# Patient Record
Sex: Female | Born: 1965 | Race: White | Hispanic: Yes | Marital: Married | Smoking: Never smoker
Health system: Southern US, Community
[De-identification: ages and names within clinical notes are randomized; demographics above are authoritative.]

## PROBLEM LIST (undated history)

## (undated) DIAGNOSIS — G43909 Migraine, unspecified, not intractable, without status migrainosus: Secondary | ICD-10-CM

## (undated) DIAGNOSIS — R42 Dizziness and giddiness: Secondary | ICD-10-CM

## (undated) HISTORY — DX: Migraine, unspecified, not intractable, without status migrainosus: G43.909

## (undated) HISTORY — PX: BREAST ENHANCEMENT SURGERY: SHX7

## (undated) HISTORY — DX: Dizziness and giddiness: R42

---

## 1995-12-13 HISTORY — PX: ABDOMINAL HYSTERECTOMY: SHX81

## 2008-10-05 IMAGING — US US-BREAST([ID])
1 series · 14 of 19 positions shown · non-contrast
Comparison: NONE

CLINICAL DATA: Left breast discharge. 

RIGHT BREAST ULTRASOUND

[Series 1: us breast · 0.05mm/px · 14 of 19 slices shown]
[im 1/19]
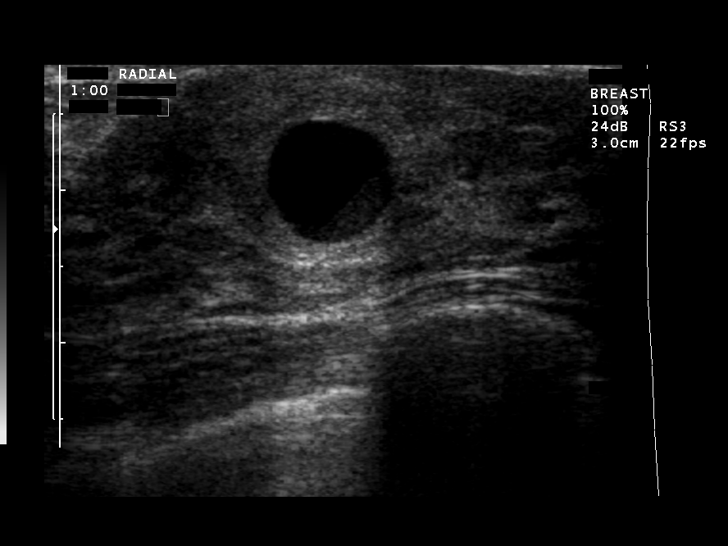
[im 3/19]
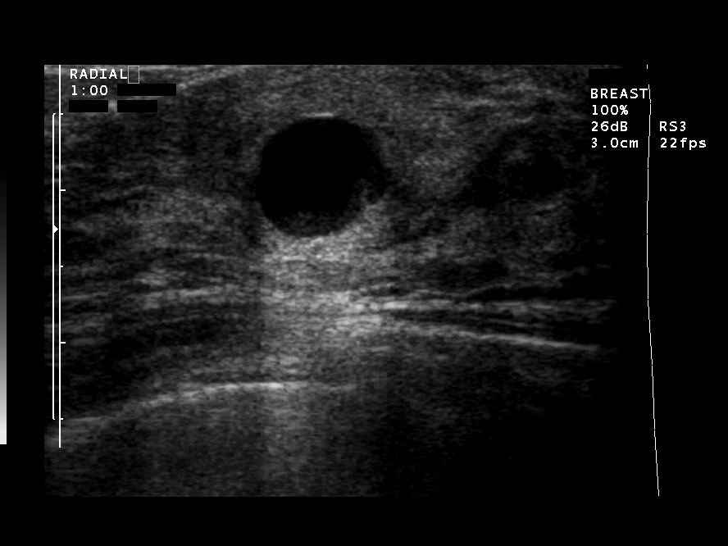
[im 4/19]
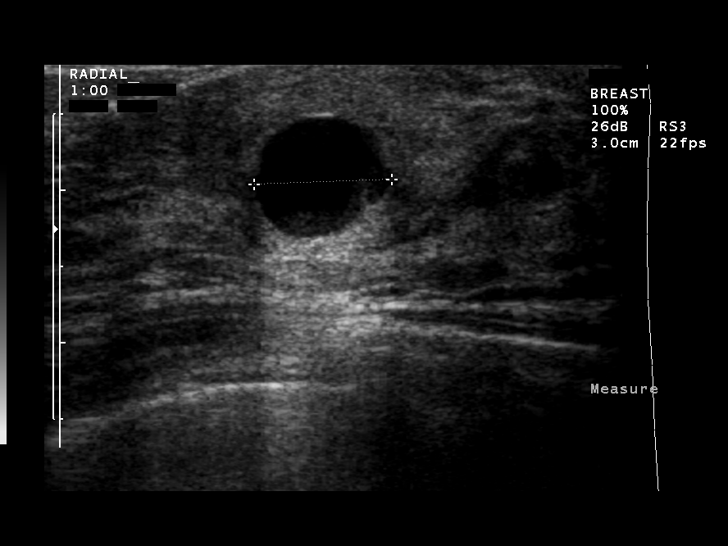
[im 5/19]
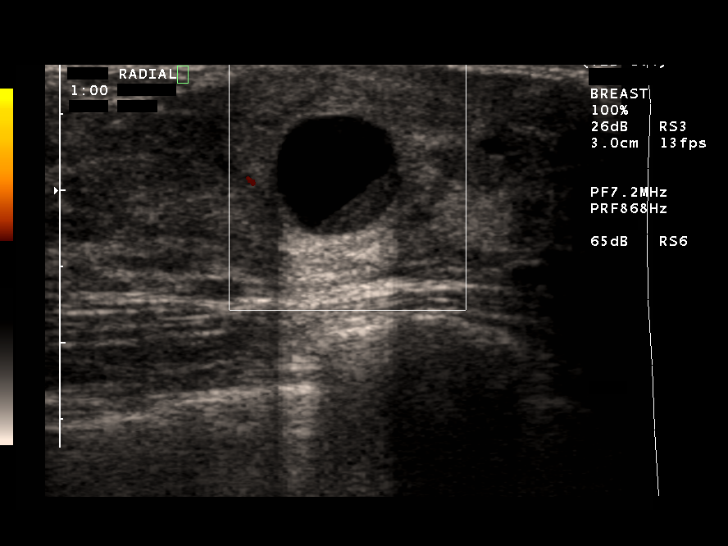
[im 7/19]
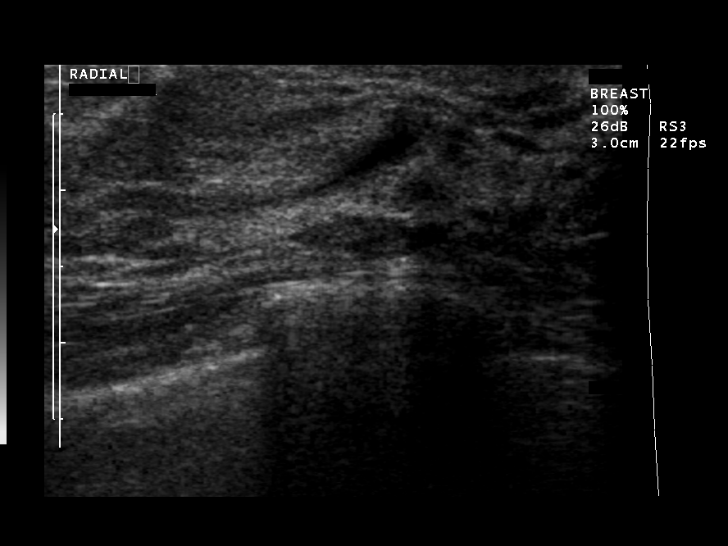
[im 8/19]
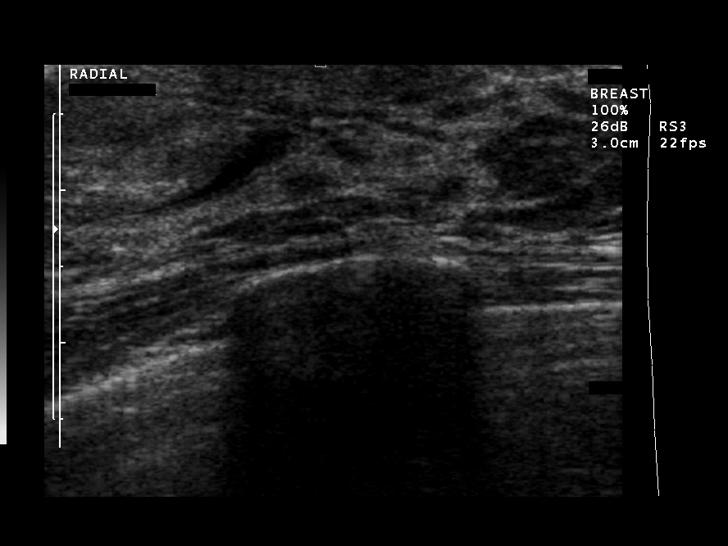
[im 9/19]
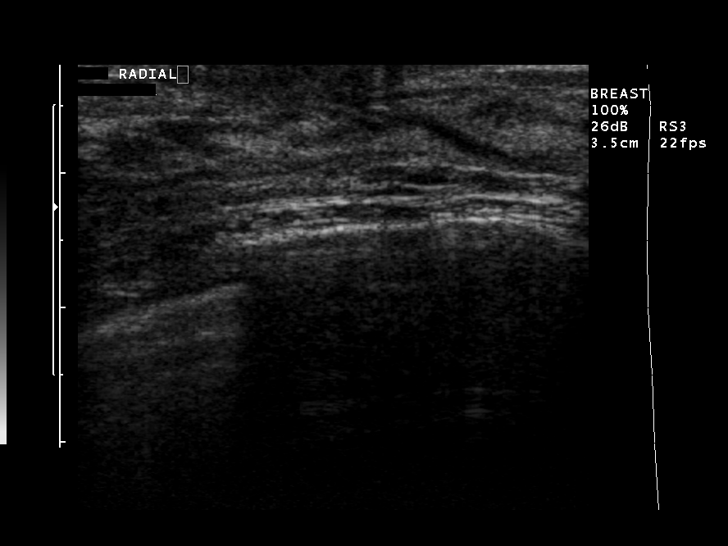
[im 11/19]
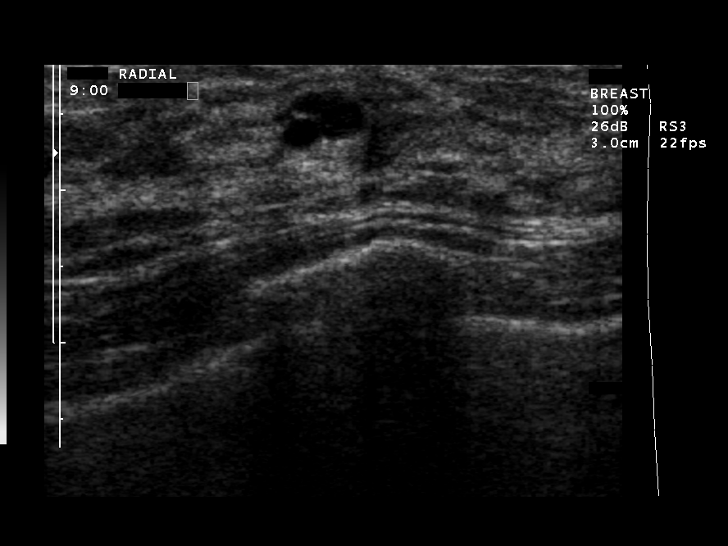
[im 12/19]
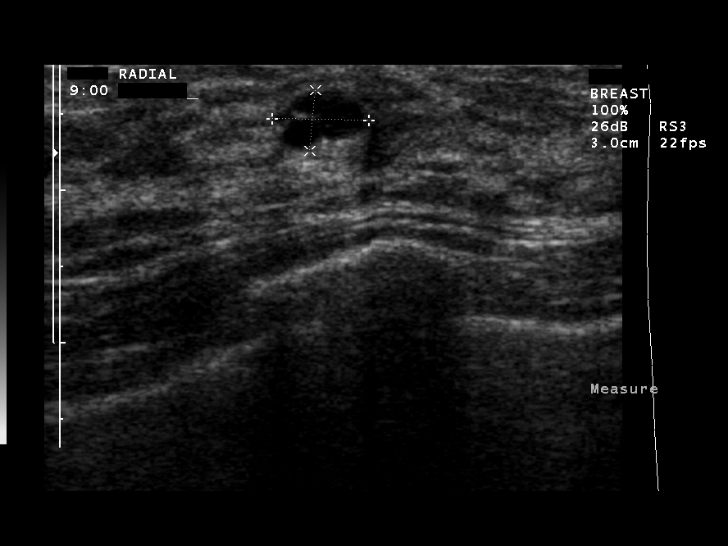
[im 13/19]
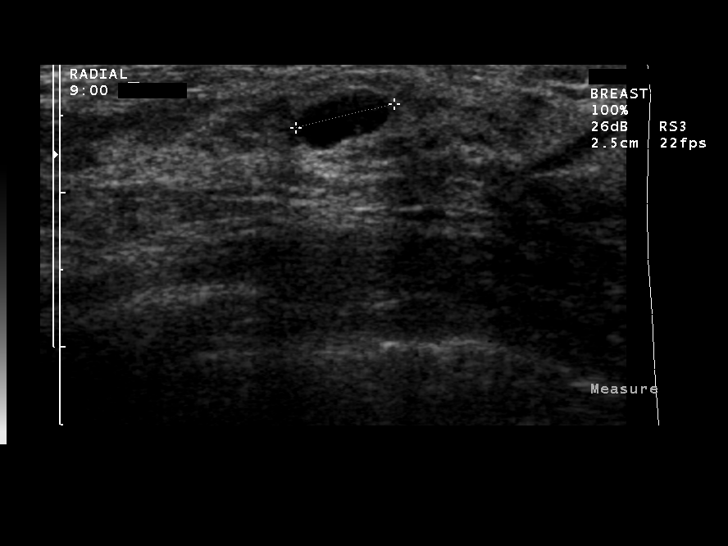
[im 15/19]
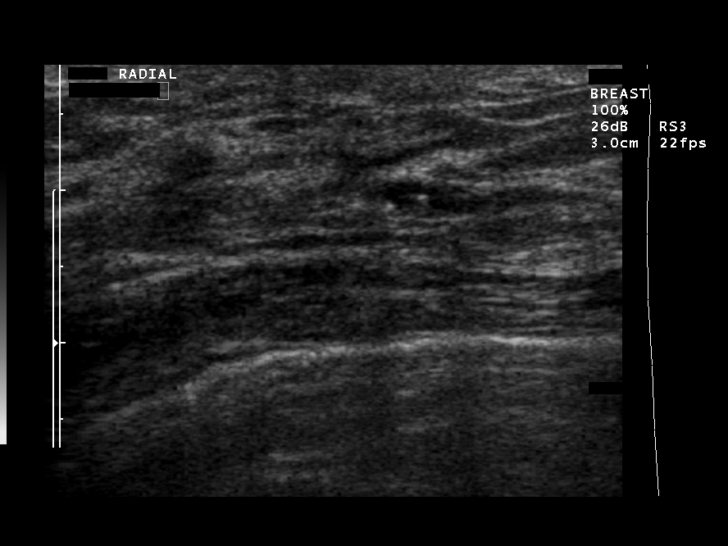
[im 16/19]
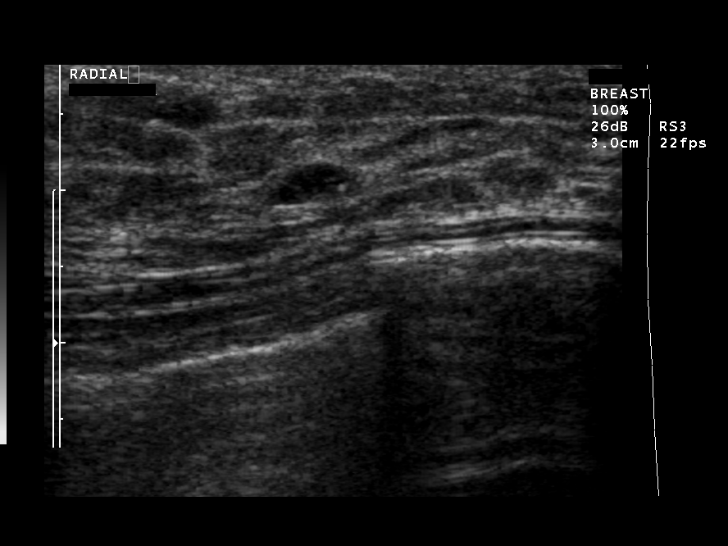
[im 17/19]
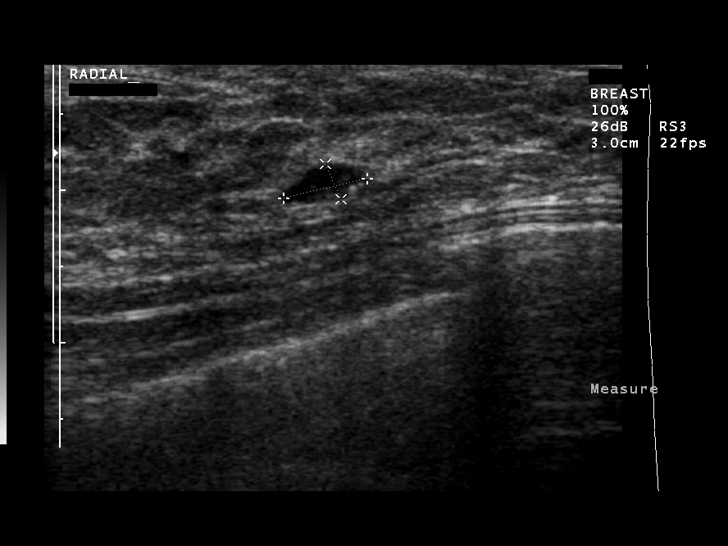
[im 19/19]
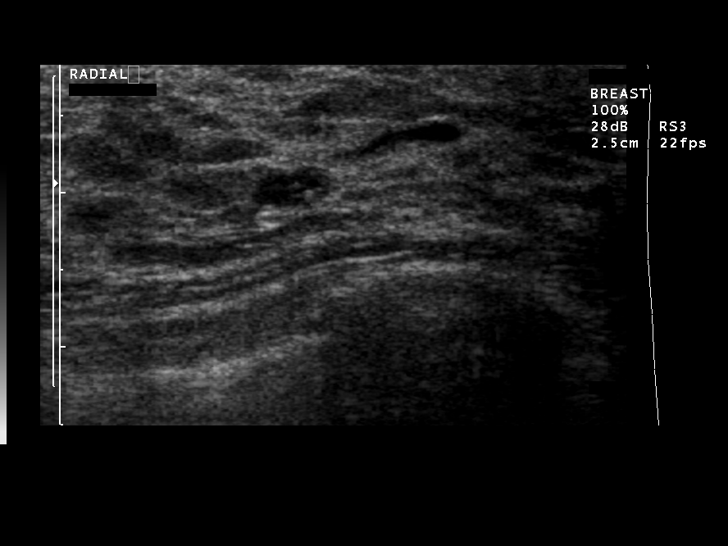

[14 of 19 positions shown; findings below may reference images not displayed]

FINDINGS: There is a cyst in the 1 o???clock position corresponding 
to the palpable abnormality noted on the mammogram. This measures 
approximately 8.5 x 8.0 x 9.0 mm. There is minimal debris within 
the cyst.
IMPRESSION: Cyst in the left breast corresponding to the 
mammographic abnormality. No mammographic findings suspicious for 
malignancy.
FINDINGS: Ultrasound of the right breast demonstrates minimal 
subcentimeter cystic change in the 9 o???clock position. No findings 
suspicious for malignancy in the right breast demonstrated by 
ultrasound. Right breast mammogram is unremarkable.
IMPRESSION: No mammographic or ultrasonographic findings 
suspicious for malignancy. BI-RADS 2- Benign Jumper, Nya Date:  01/29/2008 DAS  [REDACTED]

## 2019-07-24 ENCOUNTER — Encounter: Payer: Self-pay | Admitting: *Deleted

## 2019-07-29 ENCOUNTER — Other Ambulatory Visit: Payer: Self-pay

## 2019-07-29 ENCOUNTER — Ambulatory Visit: Payer: 59 | Admitting: Diagnostic Neuroimaging

## 2019-07-29 ENCOUNTER — Encounter: Payer: Self-pay | Admitting: Diagnostic Neuroimaging

## 2019-07-29 ENCOUNTER — Telehealth: Payer: Self-pay | Admitting: Diagnostic Neuroimaging

## 2019-07-29 VITALS — BP 120/79 | HR 69 | Temp 97.7°F | Ht 63.0 in | Wt 111.2 lb

## 2019-07-29 DIAGNOSIS — G43009 Migraine without aura, not intractable, without status migrainosus: Secondary | ICD-10-CM

## 2019-07-29 MED ORDER — AMITRIPTYLINE HCL 25 MG PO TABS: 25.0000 mg | ORAL_TABLET | Freq: Every day | ORAL | 3 refills | Status: DC

## 2019-07-29 MED ORDER — RIZATRIPTAN BENZOATE 10 MG PO TBDP: 10.0000 mg | ORAL_TABLET | ORAL | 11 refills | Status: AC | PRN

## 2019-07-29 NOTE — Patient Instructions (Signed)
MIGRAINE WITHOUT AURA - check MRI brain / MRA head - MIGRAINE PREVENTION --> start amitriptyline 25mg  at bedtime - MIGRAINE RESCUE --> rizatriptan 10mg  as needed for breakthrough headache; may repeat x 1 after 2 hours; max 2 tabs per day or 8 per month

## 2019-07-29 NOTE — Telephone Encounter (Signed)
[employer] order sent to GI. They will obtain the auth and reach out to the patient to schedule.  
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
## 2019-08-28 NOTE — Telephone Encounter (Signed)
Christella Scheuermann has the wrong DOB for the patient they have [birth year]. I spoke to the patient and informed her that she will have to give [employer] a call to update her DOB because that is holding up the process to get the the MRI's approved. Patient stated she was going to get it updated.

## 2019-08-29 ENCOUNTER — Other Ambulatory Visit: Payer: 59

## 2019-09-16 NOTE — Telephone Encounter (Signed)
Jasmin with [facility] informed me that the MRI Brain was approved but the MRA Head was canceled.. I called [employer] and they informed me the reason they canceled the MRA is because her last name is not the same as what we have and the dob is not the same as what we have. Since the patient is scheduled for the MRI Brain and for some reason that got approved for them to proceed on having the MRI Brain.

## 2019-09-18 ENCOUNTER — Ambulatory Visit
Admission: RE | Admit: 2019-09-18 | Discharge: 2019-09-18 | Disposition: A | Discharge status: Home / Self Care | Payer: 59 | Source: Ambulatory Visit | Attending: Diagnostic Neuroimaging | Admitting: Diagnostic Neuroimaging

## 2019-09-18 ENCOUNTER — Other Ambulatory Visit: Payer: 59

## 2019-09-18 ENCOUNTER — Other Ambulatory Visit: Payer: Self-pay

## 2019-09-18 DIAGNOSIS — G43009 Migraine without aura, not intractable, without status migrainosus: Secondary | ICD-10-CM

## 2019-09-18 MED ORDER — GADOBENATE DIMEGLUMINE 529 MG/ML IV SOLN
10.0000 mL | Freq: Once | INTRAVENOUS | Status: AC | PRN
  Administered 2019-09-18: 10 mL via INTRAVENOUS

## 2019-09-24 ENCOUNTER — Telehealth: Payer: Self-pay | Admitting: *Deleted

## 2019-09-24 NOTE — Telephone Encounter (Signed)
Called [facility], spoke with Terrial Rhodes and requested he call patient with MRI brain results of unremarkable scan with no acute abnormalities or abnormal lesions. He LVM advising patient of results and advised she call our office with any questions.

## 2019-10-30 ENCOUNTER — Encounter: Payer: Self-pay | Admitting: Family Medicine

## 2019-10-30 ENCOUNTER — Ambulatory Visit: Payer: 59 | Admitting: Family Medicine
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
## 2019-10-31 ENCOUNTER — Ambulatory Visit: Payer: 59 | Admitting: Family Medicine

## 2019-12-11 ENCOUNTER — Ambulatory Visit: Payer: 59 | Admitting: Family Medicine

## 2020-02-06 ENCOUNTER — Encounter: Payer: Self-pay | Admitting: Family Medicine

## 2020-02-06 ENCOUNTER — Telehealth: Payer: Self-pay | Admitting: Family Medicine

## 2020-02-06 ENCOUNTER — Other Ambulatory Visit: Payer: Self-pay

## 2020-02-06 ENCOUNTER — Ambulatory Visit (INDEPENDENT_AMBULATORY_CARE_PROVIDER_SITE_OTHER): Payer: 59 | Admitting: Family Medicine

## 2020-02-06 VITALS — BP 121/79 | HR 67 | Temp 97.6°F | Ht 61.0 in | Wt 116.0 lb

## 2020-02-06 DIAGNOSIS — Z87820 Personal history of traumatic brain injury: Secondary | ICD-10-CM | POA: Diagnosis not present

## 2020-02-06 DIAGNOSIS — R519 Headache, unspecified: Secondary | ICD-10-CM

## 2020-02-06 DIAGNOSIS — Z8249 Family history of ischemic heart disease and other diseases of the circulatory system: Secondary | ICD-10-CM

## 2020-02-06 DIAGNOSIS — G43009 Migraine without aura, not intractable, without status migrainosus: Secondary | ICD-10-CM | POA: Diagnosis not present

## 2020-02-06 MED ORDER — AJOVY 225 MG/1.5ML ~~LOC~~ SOAJ: 225.0000 mg | SUBCUTANEOUS | 11 refills | Status: AC

## 2020-02-06 NOTE — Patient Instructions (Signed)
We will try Ajovy monthly  Continue rizatriptan for abortive therapy  We will try to get MRA covered   Follow up in 3 months    Migraine Headache A migraine headache is a very strong throbbing pain on one side or both sides of your head. This type of headache can also cause other symptoms. It can last from 4 hours to 3 days. Talk with your doctor about what things may bring on (trigger) this condition. What are the causes? The exact cause of this condition is not known. This condition may be triggered or caused by:  Drinking alcohol.  Smoking.  Taking medicines, such as: ? Medicine used to treat chest pain (nitroglycerin). ? Birth control pills. ? Estrogen. ? Some blood pressure medicines.  Eating or drinking certain products.  Doing physical activity. Other things that may trigger a migraine headache include:  Having a menstrual period.  Pregnancy.  Hunger.  Stress.  Not getting enough sleep or getting too much sleep.  Weather changes.  Tiredness (fatigue). What increases the risk?  Being 85-28 years old.  Being female.  Having a family history of migraine headaches.  Being Caucasian.  Having depression or anxiety.  Being very overweight. What are the signs or symptoms?  A throbbing pain. This pain may: ? Happen in any area of the head, such as on one side or both sides. ? Make it hard to do daily activities. ? Get worse with physical activity. ? Get worse around bright lights or loud noises.  Other symptoms may include: ? Feeling sick to your stomach (nauseous). ? Vomiting. ? Dizziness. ? Being sensitive to bright lights, loud noises, or smells.  Before you get a migraine headache, you may get warning signs (an aura). An aura may include: ? Seeing flashing lights or having blind spots. ? Seeing bright spots, halos, or zigzag lines. ? Having tunnel vision or blurred vision. ? Having numbness or a tingling feeling. ? Having trouble  talking. ? Having weak muscles.  Some people have symptoms after a migraine headache (postdromal phase), such as: ? Tiredness. ? Trouble thinking (concentrating). How is this treated?  Taking medicines that: ? Relieve pain. ? Relieve the feeling of being sick to your stomach. ? Prevent migraine headaches.  Treatment may also include: ? Having acupuncture. ? Avoiding foods that bring on migraine headaches. ? Learning ways to control your body functions (biofeedback). ? Therapy to help you know and deal with negative thoughts (cognitive behavioral therapy). Follow these instructions at home: Medicines  Take over-the-counter and prescription medicines only as told by your doctor.  Ask your doctor if the medicine prescribed to you: ? Requires you to avoid driving or using heavy machinery. ? Can cause trouble pooping (constipation). You may need to take these steps to prevent or treat trouble pooping:  Drink enough fluid to keep your pee (urine) pale yellow.  Take over-the-counter or prescription medicines.  Eat foods that are high in fiber. These include beans, whole grains, and fresh fruits and vegetables.  Limit foods that are high in fat and sugar. These include fried or sweet foods. Lifestyle  Do not drink alcohol.  Do not use any products that contain nicotine or tobacco, such as cigarettes, e-cigarettes, and chewing tobacco. If you need help quitting, ask your doctor.  Get at least 8 hours of sleep every night.  Limit and deal with stress. General instructions      Keep a journal to find out what may bring on your  migraine headaches. For example, write down: ? What you eat and drink. ? How much sleep you get. ? Any change in what you eat or drink. ? Any change in your medicines.  If you have a migraine headache: ? Avoid things that make your symptoms worse, such as bright lights. ? It may help to lie down in a dark, quiet room. ? Do not drive or use heavy  machinery. ? Ask your doctor what activities are safe for you.  Keep all follow-up visits as told by your doctor. This is important. Contact a doctor if:  You get a migraine headache that is different or worse than others you have had.  You have more than 15 headache days in one month. Get help right away if:  Your migraine headache gets very bad.  Your migraine headache lasts longer than 72 hours.  You have a fever.  You have a stiff neck.  You have trouble seeing.  Your muscles feel weak or like you cannot control them.  You start to lose your balance a lot.  You start to have trouble walking.  You pass out (faint).  You have a seizure. Summary  A migraine headache is a very strong throbbing pain on one side or both sides of your head. These headaches can also cause other symptoms.  This condition may be treated with medicines and changes to your lifestyle.  Keep a journal to find out what may bring on your migraine headaches.  Contact a doctor if you get a migraine headache that is different or worse than others you have had.  Contact your doctor if you have more than 15 headache days in a month. This information is not intended to replace advice given to you by your health care provider. Make sure you discuss any questions you have with your health care provider. Document Revised: 03/22/2019 Document Reviewed: 01/10/2019 Elsevier Patient Education  [place].

## 2020-02-06 NOTE — Telephone Encounter (Signed)
Can you help me with getting MRA approved for migraines? Is this an option based on notes and history? TY!
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
## 2020-02-07 NOTE — Progress Notes (Signed)
I reviewed note and agree with plan.   Suanne Marker, MD 02/07/2020, 10:55 AM Certified in Neurology, Neurophysiology and Neuroimaging  [facility] [address], [address] [place], [place] [address] [phone]

## 2020-02-13 NOTE — Telephone Encounter (Signed)
[employer] pending faxed notes  

## 2020-02-17 NOTE — Telephone Encounter (Signed)
I did check the status it is in MD review they did receive my fax.

## 2020-02-18 NOTE — Telephone Encounter (Signed)
[employer] did not approve the MRA Head. There is an option to do a peer to peer the phone number is [phone] option 4. The case number is [id number]. It does need to be called to be scheduled. Under her insurance her name is Desiree Robinson and the case is under Dr. Marjory Lies name.

## 2020-02-19 NOTE — Telephone Encounter (Signed)
The reason for the denial   "Sudden onset of headache, we cannot approve this request. Your records show that you have a headache. The reason this request cannot be approved is because 1. Imaging may be supported for a sudden onset of headaches when one of the following applies to you. Your headache is the worst,most severe headache you have ever had. Your headache is one that feels like a clap of thunder in your head. Your headache is on one side of your head and your doctor suspects it is being caused by a tear in the wall of one of the blood vessels in your neck that supplies blood to your brain. Your headaches started when you were over 48 and there is concern for giant cell arteritis. This is a disease of the blood vessels. It can cause swelling and thickening of an artery. Your doctor is concerned that you have an acute blood clot in the channels that drain blood from your brain. Your have increased pressure in or around your brain that is causing swelling in the part of the optic nerve inside the eye."

## 2020-02-19 NOTE — Telephone Encounter (Signed)
Please let patient know that, unfortunately, insurance will not cover MRA. Although she does have a family history, insurance requires that she meet other qualifications including having a thunderclap headache, severe/worse headache ever, a suspicion for tear in vessels of neck or for a vascular condition called Giant Cell Arteritis. I am unable to support claims for any of these concerns. She may consider paying cash for test if she feels it is needed. If she has any new or worsening symptoms we can see her back.

## 2020-02-19 NOTE — Telephone Encounter (Signed)
I called pt and relayed the information from AL/NP and she verbalized understanding. She will call her insurance about this as well.

## 2021-12-22 ENCOUNTER — Ambulatory Visit: Payer: Self-pay | Admitting: Internal Medicine

## 2022-02-01 ENCOUNTER — Other Ambulatory Visit: Payer: Self-pay | Admitting: Nurse Practitioner

## 2022-02-01 ENCOUNTER — Ambulatory Visit
Admission: RE | Admit: 2022-02-01 | Discharge: 2022-02-01 | Disposition: A | Discharge status: Home / Self Care | Payer: 59 | Source: Ambulatory Visit | Attending: Nurse Practitioner | Admitting: Nurse Practitioner

## 2022-02-01 DIAGNOSIS — R52 Pain, unspecified: Secondary | ICD-10-CM

## 2022-10-13 IMAGING — CR DG THORACOLUMBAR SPINE 2V
2 series · 2 of 2 positions shown · non-contrast
Comparison: No recent prior.

CLINICAL DATA: History of pain.  No known injury.

EXAM:
THORACOLUMBAR SPINE 1V

[w thoraco-lumbar junction ap (1 of 2)]
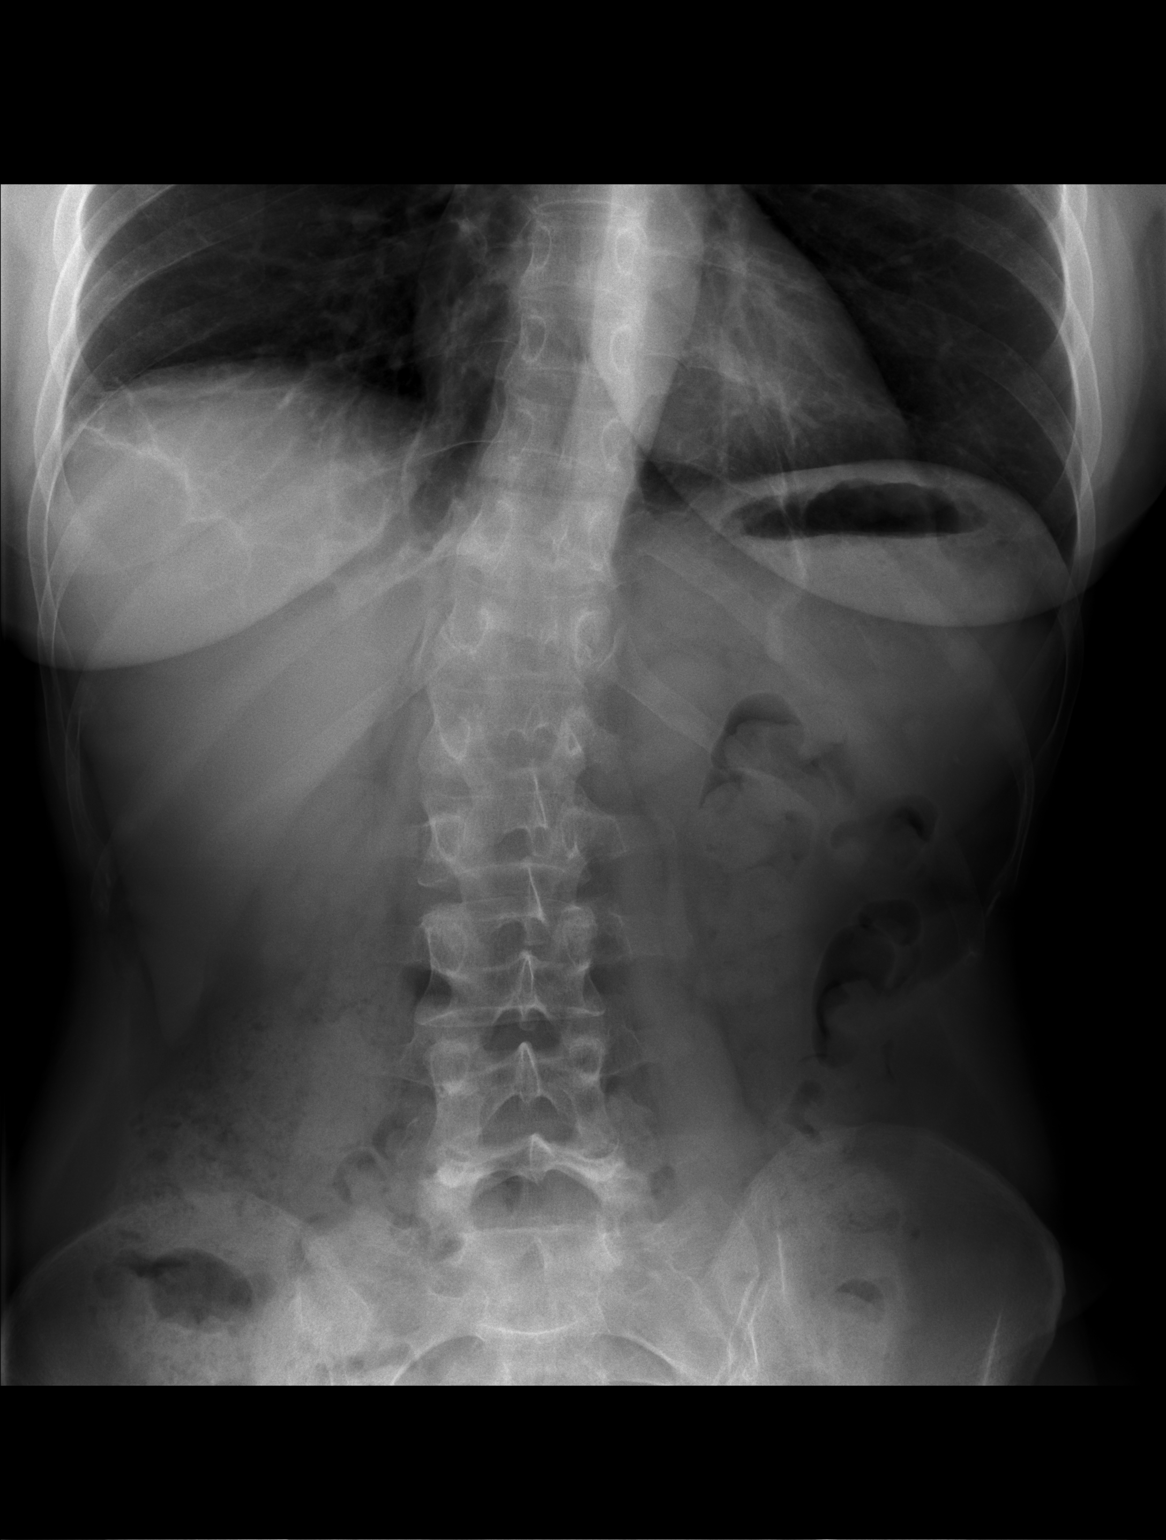

[w thoraco-lumbar junction ap (2 of 2)]
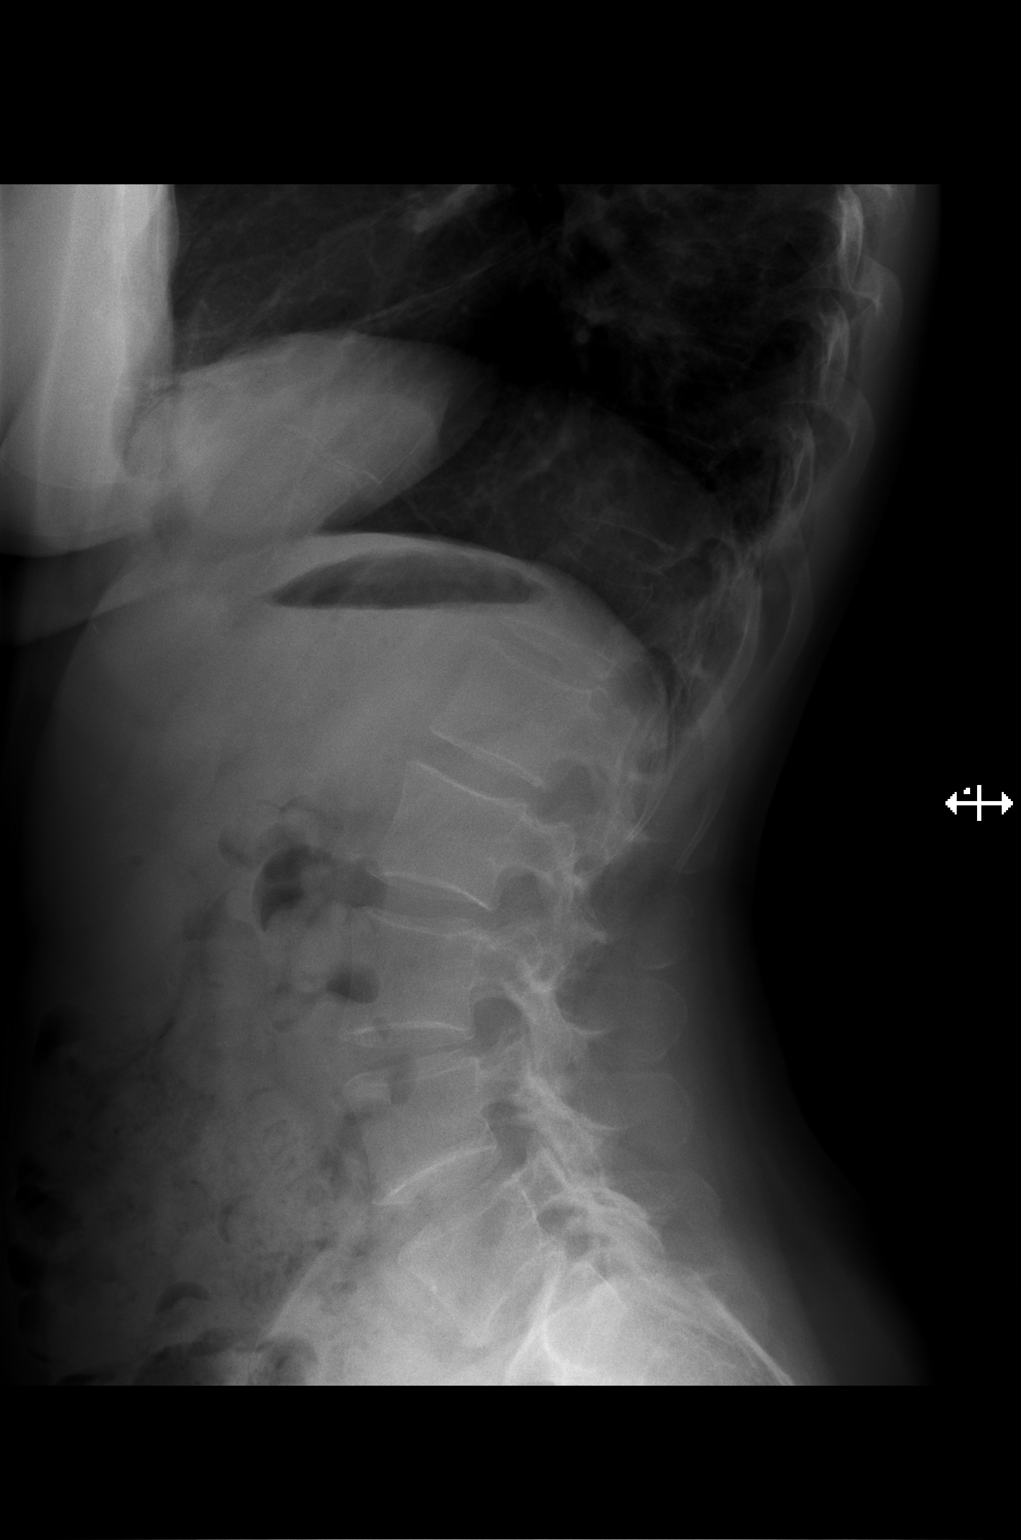

[2 of 2 positions shown; findings below may reference images not displayed]

FINDINGS: Thoracolumbar spine scoliosis. Multilevel mild degenerative endplate
osteophyte formation. No acute or focal bony abnormality identified.
Right lung base atelectasis and or scarring noted.
IMPRESSION: 1. Thoracolumbar spine scoliosis and mild multilevel degenerative
endplate osteophyte formation. No acute bony abnormality identified.

2.  Right lung base atelectasis and or scarring.
